# Patient Record
Sex: Male | Born: 1960 | Race: Black or African American | Hispanic: No | Smoking: Never smoker
Health system: Southern US, Community
[De-identification: ages and names within clinical notes are randomized; demographics above are authoritative.]

## PROBLEM LIST (undated history)

## (undated) DIAGNOSIS — E2609 Other primary hyperaldosteronism: Secondary | ICD-10-CM

## (undated) DIAGNOSIS — C61 Malignant neoplasm of prostate: Secondary | ICD-10-CM

## (undated) DIAGNOSIS — Z8052 Family history of malignant neoplasm of bladder: Secondary | ICD-10-CM

## (undated) DIAGNOSIS — I10 Essential (primary) hypertension: Secondary | ICD-10-CM

## (undated) DIAGNOSIS — Z8042 Family history of malignant neoplasm of prostate: Secondary | ICD-10-CM

## (undated) DIAGNOSIS — Z803 Family history of malignant neoplasm of breast: Secondary | ICD-10-CM

## (undated) DIAGNOSIS — E119 Type 2 diabetes mellitus without complications: Secondary | ICD-10-CM

## (undated) DIAGNOSIS — E785 Hyperlipidemia, unspecified: Secondary | ICD-10-CM

## (undated) DIAGNOSIS — Z8051 Family history of malignant neoplasm of kidney: Secondary | ICD-10-CM

## (undated) HISTORY — DX: Family history of malignant neoplasm of prostate: Z80.42

## (undated) HISTORY — PX: COLONOSCOPY: SHX174

## (undated) HISTORY — DX: Family history of malignant neoplasm of kidney: Z80.51

## (undated) HISTORY — DX: Family history of malignant neoplasm of breast: Z80.3

## (undated) HISTORY — PX: PROSTATE BIOPSY: SHX241

## (undated) HISTORY — DX: Family history of malignant neoplasm of bladder: Z80.52

---

## 2017-04-27 ENCOUNTER — Encounter: Payer: Self-pay | Admitting: Radiation Oncology

## 2017-05-05 ENCOUNTER — Encounter: Payer: Self-pay | Admitting: Radiation Oncology

## 2017-05-05 NOTE — Progress Notes (Signed)
GU Location of Tumor / Histology: prostatic adenocarcinoma  If Prostate Cancer, Gleason Score is (3 + 3) and PSA is (6.32). Prostate volume: 63.5 cc.  Legion Discher reports his PSA was first noted to be elevated during his routine physical in August 2018. PCP: Jefm Petty found the elevated PSA.  Also, Dr. Donley Redder is his urologist and Dr. Nydia Bouton is his nephrologist  Biopsies of prostate (if applicable) revealed:      Past/Anticipated interventions by urology, if any: prostate biopsy, referral to Dr. Tammi Klippel for consideration of radiation therapy  Past/Anticipated interventions by medical oncology, if any: no  Weight changes, if any: no  Bowel/Bladder complaints, if any: IPSS 6. Mild ED. Reports nocturia. Denies dysuria, hematuria, leakage or incontinence.   Nausea/Vomiting, if any: no  Pain issues, if any:  no  SAFETY ISSUES:  Prior radiation? no  Pacemaker/ICD? no  Possible current pregnancy? no  Is the patient on methotrexate? no  Current Complaints / other details:  57 year old male. Significant other: [place]. Has 9 siblings and he is the second to the youngest. Strong family hx of cancer. Patient reports being most interested in seeds but looking to explore all options.

## 2017-05-08 ENCOUNTER — Ambulatory Visit
Admission: RE | Admit: 2017-05-08 | Discharge: 2017-05-08 | Disposition: A | Discharge status: Home / Self Care | Payer: 59 | Source: Ambulatory Visit | Attending: Radiation Oncology | Admitting: Radiation Oncology

## 2017-05-08 ENCOUNTER — Encounter: Payer: Self-pay | Admitting: Medical Oncology

## 2017-05-08 ENCOUNTER — Encounter: Payer: Self-pay | Admitting: Radiation Oncology

## 2017-05-08 ENCOUNTER — Other Ambulatory Visit: Payer: Self-pay

## 2017-05-08 VITALS — BP 148/99 | HR 80 | Temp 98.3°F | Resp 18 | Ht 69.0 in | Wt 191.0 lb

## 2017-05-08 DIAGNOSIS — Z809 Family history of malignant neoplasm, unspecified: Secondary | ICD-10-CM

## 2017-05-08 DIAGNOSIS — Z7984 Long term (current) use of oral hypoglycemic drugs: Secondary | ICD-10-CM | POA: Diagnosis not present

## 2017-05-08 DIAGNOSIS — Z79899 Other long term (current) drug therapy: Secondary | ICD-10-CM | POA: Diagnosis not present

## 2017-05-08 DIAGNOSIS — Z806 Family history of leukemia: Secondary | ICD-10-CM | POA: Diagnosis not present

## 2017-05-08 DIAGNOSIS — E119 Type 2 diabetes mellitus without complications: Secondary | ICD-10-CM | POA: Insufficient documentation

## 2017-05-08 DIAGNOSIS — C61 Malignant neoplasm of prostate: Secondary | ICD-10-CM | POA: Insufficient documentation

## 2017-05-08 DIAGNOSIS — Z803 Family history of malignant neoplasm of breast: Secondary | ICD-10-CM | POA: Diagnosis not present

## 2017-05-08 DIAGNOSIS — Z8051 Family history of malignant neoplasm of kidney: Secondary | ICD-10-CM | POA: Insufficient documentation

## 2017-05-08 DIAGNOSIS — Z8042 Family history of malignant neoplasm of prostate: Secondary | ICD-10-CM | POA: Insufficient documentation

## 2017-05-08 DIAGNOSIS — I10 Essential (primary) hypertension: Secondary | ICD-10-CM | POA: Diagnosis not present

## 2017-05-08 HISTORY — DX: Essential (primary) hypertension: I10

## 2017-05-08 HISTORY — DX: Type 2 diabetes mellitus without complications: E11.9

## 2017-05-08 HISTORY — DX: Malignant neoplasm of prostate: C61

## 2017-05-08 NOTE — Progress Notes (Signed)
Introduced myself to patient and significant other as the prostate nurse navigator and my role. He states he is leaning towards brachytherapy. He currently sees Dr. Bal[facility]  and will need a referral to [facility]. I will continue to follow and asked him to call me with questions or concerns.

## 2017-05-08 NOTE — Progress Notes (Signed)
See progress note under physician encounter. 

## 2017-05-08 NOTE — Progress Notes (Addendum)
Radiation Oncology         [phone] ________________________________  Initial Outpatient Consultation  Name: Devin Gibson MRN: [id number]  Date: 05/08/2017  DOB: [birth year]  [id number], Legrand Como, MD  Ollen Bowl, MD   REFERRING PHYSICIAN: Ollen Bowl, MD  DIAGNOSIS: 57 y.o. 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    ICD-10-CM   1. Malignant neoplasm of prostate [facility]) [place] Ambulatory Referral to Genetics    Ambulatory referral to Urology  2. Family history of cancer Z80.9     HISTORY OF PRESENT ILLNESS: Devin Gibson is a 57 y.o. male with a new diagnosis of prostate cancer. He was first noted to have an elevated PSA in August 2018 during a routine physical exam by his primary care physician, Dr. Jefm Petty.  Accordingly, he was referred for evaluation in urology to Dr. Ollen Bowl, where a repeat PSA was drawn and remained elevated at 6.32. The patient proceeded to transrectal ultrasound with 12 biopsies of the prostate on 12/26/2016.  The prostate volume measured 63.5 cc.  Out of 12 core biopsies, 4 were positive.  The maximum Gleason score was 3+3, and this was seen in the right apex, right mid, right apex lateral, and right lateral mid. A digital rectal examination was performed on 01/31/2017 revealing a right-sided nodule.    The patient reviewed the biopsy results with his urologist and he has kindly been referred today for discussion of potential radiation treatment options. He is accompanied by his longtime girlfriend.   PREVIOUS RADIATION THERAPY: No  PAST MEDICAL HISTORY:  Past Medical History:  Diagnosis Date  . Diabetes mellitus without complication ([place])   . Hypertension   . Prostate cancer ([place])       PAST SURGICAL HISTORY: Past Surgical History:  Procedure Laterality Date  . PROSTATE BIOPSY      FAMILY HISTORY:  Family History  Problem Relation Age of Onset  . Kidney cancer  Sister        removed kidney/oral medication  . Prostate cancer Brother        prostatectomy  . Breast cancer Sister        lumpectomy and chemotherapy  . Leukemia Father     SOCIAL HISTORY:  Social History   Socioeconomic History  . Marital status: Significant Other    Spouse name: Not on file  . Number of children: Not on file  . Years of education: Not on file  . Highest education level: Not on file  Social Needs  . Financial resource strain: Not on file  . Food insecurity - worry: Not on file  . Food insecurity - inability: Not on file  . Transportation needs - medical: Not on file  . Transportation needs - non-medical: Not on file  Occupational History  . Not on file  Tobacco Use  . Smoking status: Never Smoker  . Smokeless tobacco: Never Used  Substance and Sexual Activity  . Alcohol use: No    Frequency: Never  . Drug use: No  . Sexual activity: Yes    Birth control/protection: Condom  Other Topics Concern  . Not on file  Social History Narrative   Patient and his significant other, [name], have been together for 29 years. 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  The patient is in a relationship with his girlfriend [place]. 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   ALLERGIES: Patient has no known allergies.  MEDICATIONS:  Current Outpatient Medications  Medication Sig Dispense Refill  . amLODipine-benazepril (LOTREL) 10-40 MG capsule Take by mouth.    Marland Kitchen atorvastatin (LIPITOR) 20 MG tablet Take by mouth.    . metFORMIN (GLUCOPHAGE) 1000 MG tablet Take by mouth.    . metoprolol succinate (TOPROL-XL) 100 MG 24 hr tablet Take 100 mg by mouth daily. Take with or immediately following a meal.    . spironolactone (ALDACTONE) 50 MG tablet Take by mouth.     No current facility-administered medications for this encounter.     REVIEW OF SYSTEMS:  On review of systems, the patient reports that he is doing  well overall. He denies any chest pain, shortness of breath, cough, fevers, chills, night sweats, or unintended weight changes. He denies any bowel disturbances, and denies abdominal pain, nausea or vomiting. He denies any new musculoskeletal or joint aches or pains. His IPSS was 6, indicating mild urinary symptoms with nocturia. He denies any dysuria, hematuria, leakage or incontinence. He reports mild erectile dysfunction. A complete review of systems is obtained and is otherwise negative.    PHYSICAL EXAM:  Wt Readings from Last 3 Encounters:  05/08/17 191 lb (86.6 kg)   Temp Readings from Last 3 Encounters:  05/08/17 98.3 F (36.8 C) (Oral)   BP Readings from Last 3 Encounters:  05/08/17 (!) 148/99   Pulse Readings from Last 3 Encounters:  05/08/17 80   Pain Assessment Pain Score: 0-No pain/10  In general this is a well appearing African-American man in no acute distress. He is alert and oriented x4 and appropriate throughout the examination. HEENT reveals that the patient is normocephalic, atraumatic. Skin is intact without any evidence of gross lesions. Cardiovascular exam reveals a regular rate and rhythm, no clicks rubs or murmurs are auscultated. Chest is clear to auscultation bilaterally. Lymphatic assessment is performed and does not reveal any adenopathy in the cervical, supraclavicular, axillary, or inguinal chains.   KPS = 100  100 - Normal; no complaints; no evidence of disease. 90   - Able to carry on normal activity; minor signs or symptoms of disease. 80   - Normal activity with effort; some signs or symptoms of disease. 43   - Cares for self; unable to carry on normal activity or to do active work. 60   - Requires occasional assistance, but is able to care for most of his personal needs. 50   - Requires considerable assistance and frequent medical care. 31   - Disabled; requires special care and assistance. 54   - Severely disabled; hospital admission is indicated  although death not imminent. 69   - Very sick; hospital admission necessary; active supportive treatment necessary. 10   - Moribund; fatal processes progressing rapidly. 0     - Dead  Karnofsky DA, Abelmann WH, Craver LS and Burchenal JH [phone]) The use of the nitrogen mustards in the palliative treatment of carcinoma: with particular reference to bronchogenic carcinoma Cancer 1 634-56  LABORATORY DATA:  No results found for: WBC, HGB, HCT, MCV, PLT No results found for: NA, K, CL, CO2 No results found for: ALT, AST, GGT, ALKPHOS, BILITOT   RADIOGRAPHY: No results found.    IMPRESSION/PLAN: 1. 57 y.o. gentleman with Low risk, T2a adenocarcinoma of the prostate with Gleason Score of 3+3, and PSA of 6.32. We discussed the pathology findings and reviews the nature of low risk adenocarcinoma of the prostate.  We discussed how T-stage, Gleason Score, and PSA impact recommendations of treatment. He is a candidate for active surveillance, prostatectomy, external radiotherapy, or brachytherapy. We reviewed the options of radiotherapy as the patient is interested in treatment rather than active surveillance, but is not interested in surgery. After reviewing the options and logistics of treatment, he favors radioactive seed implant.   We discussed the risks, benefits, short, and long term effects of radiotherapy, and the patient is interested in proceeding. We will coordinate for referral to urology so he can meet one of the local urologists in order to proceed with seed implant. He is in agreement and will call us if he has questions or concerns regarding this process.  2. Possible genetic predisposition to malignancy. The patient is counseled on the role of genetic testing given his personal and family history. He is in agreement with referral.        Carola Rhine, PAC And  Sheral Apley Tammi Klippel, M.D.  This document serves as a record of services personally performed by Tyler Pita, MD and  Shona Simpson, PA-C. It was created on their behalf by Rae Lips, a trained medical scribe. The creation of this record is based on the scribe's personal observations and the providers' statements to them. This document has been checked and approved by the attending providers.

## 2017-05-09 ENCOUNTER — Telehealth: Payer: Self-pay | Admitting: *Deleted

## 2017-05-09 NOTE — Telephone Encounter (Signed)
Called patient to inform of consultation with Dr. Diona Fanti on 06-05-17 - arrival time - 2 pm, spoke with patient's wife Masson Nalepa and she is aware of this appt.

## 2017-05-12 DIAGNOSIS — C61 Malignant neoplasm of prostate: Secondary | ICD-10-CM | POA: Insufficient documentation

## 2017-06-07 ENCOUNTER — Encounter: Payer: Self-pay | Admitting: Urology

## 2017-06-07 NOTE — Progress Notes (Signed)
This patient met with Dr. Diona Fanti in consult on 06/05/2017  To discuss options for treatment of his prostate cancer.  Dr. Diona Fanti agrees that he is an excellent candidate for brachytherapy and is happy to participate in this procedure.  We will begin moving forward with coordinating this procedure with Dr. Alan Ripper surgery schedulers for the near future.

## 2017-06-14 ENCOUNTER — Telehealth: Payer: Self-pay | Admitting: *Deleted

## 2017-06-14 NOTE — Telephone Encounter (Signed)
Called patient to inform of pre-seed appts. For 06-30-17 and his implant on 08-21-17 @ 2 pm, spoke with patient's sgn. other - Tameka and she is aware of these appts.

## 2017-06-15 ENCOUNTER — Other Ambulatory Visit: Payer: Self-pay | Admitting: Urology

## 2017-06-29 ENCOUNTER — Inpatient Hospital Stay: Payer: 59

## 2017-06-29 ENCOUNTER — Telehealth: Payer: Self-pay | Admitting: *Deleted

## 2017-06-29 ENCOUNTER — Inpatient Hospital Stay: Payer: 59 | Attending: Genetic Counselor | Admitting: Genetics

## 2017-06-29 DIAGNOSIS — Z8051 Family history of malignant neoplasm of kidney: Secondary | ICD-10-CM | POA: Diagnosis not present

## 2017-06-29 DIAGNOSIS — Z803 Family history of malignant neoplasm of breast: Secondary | ICD-10-CM

## 2017-06-29 DIAGNOSIS — Z8042 Family history of malignant neoplasm of prostate: Secondary | ICD-10-CM | POA: Diagnosis not present

## 2017-06-29 DIAGNOSIS — Z8052 Family history of malignant neoplasm of bladder: Secondary | ICD-10-CM | POA: Diagnosis not present

## 2017-06-29 DIAGNOSIS — C61 Malignant neoplasm of prostate: Secondary | ICD-10-CM | POA: Diagnosis not present

## 2017-06-29 NOTE — Telephone Encounter (Signed)
CALLED PATIENT TO REMIND OF PRE-SEED APPTS. FOR 06-30-17, SPOKE WITH PATIENT AND HE IS AWARE OF THESE APPTS.

## 2017-06-30 ENCOUNTER — Encounter: Payer: Self-pay | Admitting: Genetics

## 2017-06-30 ENCOUNTER — Other Ambulatory Visit: Payer: Self-pay | Admitting: Urology

## 2017-06-30 ENCOUNTER — Ambulatory Visit
Admission: RE | Admit: 2017-06-30 | Discharge: 2017-06-30 | Disposition: A | Discharge status: Home / Self Care | Payer: 59 | Source: Ambulatory Visit | Attending: Radiation Oncology | Admitting: Radiation Oncology

## 2017-06-30 ENCOUNTER — Other Ambulatory Visit: Payer: Self-pay

## 2017-06-30 ENCOUNTER — Encounter: Payer: Self-pay | Admitting: Medical Oncology

## 2017-06-30 ENCOUNTER — Encounter (HOSPITAL_COMMUNITY)
Admission: RE | Admit: 2017-06-30 | Discharge: 2017-06-30 | Disposition: A | Discharge status: Home / Self Care | Payer: 59 | Source: Ambulatory Visit | Attending: Urology | Admitting: Urology

## 2017-06-30 ENCOUNTER — Ambulatory Visit (HOSPITAL_COMMUNITY)
Admission: RE | Admit: 2017-06-30 | Discharge: 2017-06-30 | Disposition: A | Discharge status: Home / Self Care | Payer: 59 | Source: Ambulatory Visit | Attending: Urology | Admitting: Urology

## 2017-06-30 DIAGNOSIS — C61 Malignant neoplasm of prostate: Secondary | ICD-10-CM | POA: Diagnosis present

## 2017-06-30 DIAGNOSIS — Z510 Encounter for antineoplastic radiation therapy: Secondary | ICD-10-CM | POA: Diagnosis not present

## 2017-06-30 DIAGNOSIS — Z8052 Family history of malignant neoplasm of bladder: Secondary | ICD-10-CM | POA: Insufficient documentation

## 2017-06-30 DIAGNOSIS — Z01810 Encounter for preprocedural cardiovascular examination: Secondary | ICD-10-CM | POA: Insufficient documentation

## 2017-06-30 DIAGNOSIS — Z8051 Family history of malignant neoplasm of kidney: Secondary | ICD-10-CM | POA: Insufficient documentation

## 2017-06-30 DIAGNOSIS — Z803 Family history of malignant neoplasm of breast: Secondary | ICD-10-CM | POA: Insufficient documentation

## 2017-06-30 DIAGNOSIS — Z01818 Encounter for other preprocedural examination: Secondary | ICD-10-CM | POA: Diagnosis present

## 2017-06-30 DIAGNOSIS — Z8042 Family history of malignant neoplasm of prostate: Secondary | ICD-10-CM | POA: Insufficient documentation

## 2017-06-30 NOTE — Progress Notes (Signed)
  Radiation Oncology         [phone]) [phone] ________________________________  Name: Devin Gibson MRN: [id number]  Date: 06/30/2017  DOB: [birth year]  SIMULATION AND TREATMENT PLANNING NOTE PUBIC ARCH STUDY  [id number], Legrand Como, MD  Ollen Bowl, MD  DIAGNOSIS: 57 y.o. 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     ICD-10-CM   1. Malignant neoplasm of prostate ([place]) C61     COMPLEX SIMULATION:  The patient presented today for evaluation for possible prostate seed implant. He was brought to the radiation planning suite and placed supine on the CT couch. A 3-dimensional image study set was obtained in upload to the planning computer. There, on each axial slice, I contoured the prostate gland. Then, using three-dimensional radiation planning tools I reconstructed the prostate in view of the structures from the transperineal needle pathway to assess for possible pubic arch interference. In doing so, I did not appreciate any pubic arch interference. Also, the patient's prostate volume was estimated based on the drawn structure. The volume was 54 cc.  Given the pubic arch appearance and prostate volume, patient remains a good candidate to proceed with prostate seed implant. Today, he freely provided informed written consent to proceed.    PLAN: The patient will undergo prostate seed implant.   ________________________________  Sheral Apley. Tammi Klippel, M.D.   This document serves as a record of services personally performed by Tyler Pita, MD. It was created on his behalf by Arlyce Harman, a trained medical scribe. The creation of this record is based on the scribe's personal observations and the provider's statements to them. This document has been checked and approved by the attending provider.

## 2017-06-30 NOTE — Progress Notes (Signed)
REFERRING PROVIDER: Hayden Pedro, PA-C [place], [place] [address]  PRIMARY PROVIDER:  Jefm Petty, MD  PRIMARY REASON FOR VISIT:  1. Malignant neoplasm of prostate ([place])   2. Family history of kidney cancer   3. Family history of prostate cancer   4. Family history of breast cancer   5. Family history of bladder cancer     HISTORY OF PRESENT ILLNESS:   Mr. Devin Gibson, a 57 y.o. male, was seen for a [place] cancer genetics consultation at the request of Dr. Dara Lords due to a personal and family history of cancer.  Devin Gibson presents to clinic today to discuss the possibility of a hereditary predisposition to cancer, genetic testing, and to further clarify his future cancer risks, as well as potential cancer risks for family members.   Devin Gibson is a 57 year old man recently diagnosed with adenocarcinoma of the prostate with Gleason score 3+3=6.  He will be undergoing prostate seed implant.  Colonoscopy: yes; reportedly normal.   Past Medical History:  Diagnosis Date  . Diabetes mellitus without complication ([place])   . Family history of bladder cancer   . Family history of breast cancer   . Family history of kidney cancer   . Family history of prostate cancer   . Hypertension   . Prostate cancer [facility])     Past Surgical History:  Procedure Laterality Date  . PROSTATE BIOPSY      Social History   Socioeconomic History  . Marital status: Significant Other    Spouse name: Not on file  . Number of children: Not on file  . Years of education: Not on file  . Highest education level: Not on file  Occupational History  . Not on file  Social Needs  . Financial resource strain: Not on file  . Food insecurity:    Worry: Not on file    Inability: Not on file  . Transportation needs:    Medical: Not on file    Non-medical: Not on file  Tobacco Use  . Smoking status: Never Smoker  . Smokeless tobacco: Never Used  Substance and Sexual Activity   . Alcohol use: No    Frequency: Never  . Drug use: No  . Sexual activity: Yes    Birth control/protection: Condom  Lifestyle  . Physical activity:    Days per week: Not on file    Minutes per session: Not on file  . Stress: Not on file  Relationships  . Social connections:    Talks on phone: Not on file    Gets together: Not on file    Attends religious service: Not on file    Active member of club or organization: Not on file    Attends meetings of clubs or organizations: Not on file    Relationship status: Not on file  Other Topics Concern  . Not on file  Social History Narrative   Patient and his significant other, [name], have been together for 29 years. 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     FAMILY HISTORY:  We obtained a detailed, 4-generation family history.  Significant diagnoses are listed below: Family History  Problem Relation Age of Onset  . Kidney cancer Sister 59       removed kidney/oral medication  . Prostate cancer Brother 76       prostatectomy  . Breast cancer Sister 50  lumpectomy and chemotherapy  . Leukemia Father 61  . Cancer Paternal Aunt        type unk  . Lung cancer Paternal Uncle   . Bladder Cancer Other        dx >50    Devin Gibson has 1 daughter who is 84 with no history of cancer.  He has 4 grandchildren with no history of cancer. Devin Gibson has 2 brothers and 7 sisters: -1 brother was diagnosed with prostate cancer at 53, he had a prostatectomy. He has 4 sons and a daughter with no history of cancer.  -1 brother has no history of cancer and has a son and daughter with no history of cancer.  -1 sister with no history of cancer.  She had a son who developed bladder cancer dx >50.  He had a history of smoking.  -1 sister died at 5 due to kidney cancer that was diagnosed at 62.   -1 sister was diagnosed with breast cancer at 91.  She has a son and a daughter with no history of cancer.  -4  sisters with no history of cancer.   Devin Gibson' father died at 61 and had a history of leukemia dx in his 70's/80's.  Devin Gibson had 4 paternal uncles and 4 paternal aunts.  One aunt died of cancer, the type and age of dx is unknown.  One paternal uncle had lung cancer.  No paternal cousins with any history of cancer.  Devin Gibson' dies not know any information about his paternal grandparents' health.    Devin Gibson' mother very recently died at the age of 14 with no history of cancer.  Devin Gibson has 1 maternal uncle and 3 maternal aunts with no history of cancer.  No maternal cousins with any history of cancer.  Devin Gibson does no know any information about his paternal grandparents.   Devin Gibson is unaware of previous family history of genetic testing for hereditary cancer risks. Patient's maternal ancestors are of African American descent, and paternal ancestors are of African American descent. There is no reported Ashkenazi Jewish ancestry. There is no known consanguinity.  GENETIC COUNSELING ASSESSMENT: [name] is a 57 y.o. male with a personal and family history which is somewhat suggestive of a Hereditary Cancer Predisposition Syndrome. We, therefore, discussed and recommended the following at today's visit.   DISCUSSION: We reviewed the characteristics, features and inheritance patterns of hereditary cancer syndromes. We also discussed genetic testing, including the appropriate family members to test, the process of testing, insurance coverage and turn-around-time for results. We discussed the implications of a negative, positive and/or variant of uncertain significant result. We recommended Mr. Lahaie pursue genetic testing for the Common hereditary Cancer Panel + Renal/Urinary Tract cancers gene panel. The Common Hereditary Cancer Panel offered by [facility] includes sequencing and/or deletion duplication testing of the following 47 genes: APC, ATM, AXIN2, BARD1, BMPR1A,  BRCA1, BRCA2, BRIP1, CDH1, CDKN2A (p14ARF), CDKN2A (p16INK4a), CKD4, CHEK2, CTNNA1, DICER1, EPCAM (Deletion/duplication testing only), GREM1 (promoter region deletion/duplication testing only), KIT, MEN1, MLH1, MSH2, MSH3, MSH6, MUTYH, NBN, NF1, NHTL1, PALB2, PDGFRA, PMS2, POLD1, POLE, PTEN, RAD50, RAD51C, RAD51D, SDHB, SDHC, SDHD, SMAD4, SMARCA4. STK11, TP53, TSC1, TSC2, and VHL.  The following genes were evaluated for sequence changes only: SDHA and HOXB13 c.251G>A variant only.  [facility] Renal/Urinary Tract Cancers Panel:BAP1 CDC73 CDKN1C DICER1 DIS3L2 EPCAM FH FLCN GPC3 MET MLH1 MSH2 MSH6 PMS2 PTEN SDHB SDHC SMARCA4 SMARCB1 TP53 TSC1 TSC2 VHL WT1 BUB1B CEP57 MITF  PALB2 SDHA SDHD   We discussed that only 5-10% of cancers are associated with a Hereditary cancer predisposition syndrome.  One of the most common hereditary cancer syndromes that increases prostate cancer risk is called Hereditary Breast and Ovarian Cancer (HBOC) syndrome.  This syndrome is caused by mutations in the BRCA1 and BRCA2 genes.  This syndrome increases an individual's lifetime risk to develop prostate, breast, ovarian, pancreatic, and other types of cancer.  There are also many other cancer predisposition syndromes caused by mutations in several other genes.    We discussed that if he is found to have a mutation in one of these genes, it may impact future medical management recommendations such as increased cancer screenings and consideration of risk reducing surgeries.  A positive result could also have implications for the patient's family members.  A Negative result would mean we were unable to identify a hereditary component to his cancer, but does not rule out the possibility of a hereditary basis for his cancer.  There could be mutations that are undetectable by current technology, or in genes not yet tested or identified to increase cancer risk.    We discussed the potential to find a Variant of Uncertain Significance or  VUS.  These are variants that have not yet been identified as pathogenic or benign, and it is unknown if this variant is associated with increased cancer risk or if this is a normal finding.  Most VUS's are reclassified to benign or likely benign.   It should not be used to make medical management decisions. With time, we suspect the lab will determine the significance of any VUS's identified if any.   Based on Mr. Salts personal and family history of cancer, he meets medical criteria for genetic testing. Despite that he meets criteria, he may still have an out of pocket cost. We discussed that if his out of pocket cost for testing is over $100, the laboratory will call and confirm whether he wants to proceed with testing.  If the out of pocket cost of testing is less than $100 he will be billed by the genetic testing laboratory.   PLAN: After considering the risks, benefits, and limitations, Mr. Mcneill  provided informed consent to pursue genetic testing and the blood sample was sent to [facility] for analysis of the Common Hereditary Cancers Panel + Renal/Urinary Tract cancer panel. Results should be available within approximately 2-3 weeks' time, at which point they will be disclosed by telephone to Mr. Martos, as will any additional recommendations warranted by these results. Mr. Wambold will receive a summary of his genetic counseling visit and a copy of his results once available. This information will also be available in Epic. We encouraged Mr. Rawlinson to remain in contact with cancer genetics annually so that we can continuously update the family history and inform him of any changes in cancer genetics and testing that may be of benefit for his family. Mr. Misch questions were answered to his satisfaction today. Our contact information was provided should additional questions or concerns arise.  Based on Mr. Iyer family history, we recommended his sister who had breast  cancer at 41 have genetic counseling and testing. Mr. Caudill will let us know if we can be of any assistance in coordinating genetic counseling and/or testing for this family member.   Lastly, we encouraged Mr. Summer to remain in contact with cancer genetics annually so that we can continuously update the family history and inform him of any  changes in cancer genetics and testing that may be of benefit for this family.   Mr.  Lanum questions were answered to his satisfaction today. Our contact information was provided should additional questions or concerns arise. Thank you for the referral and allowing [place] to share in the care of your patient.   Tana Felts, MS, [facility] Certified Genetic Counselor [email or web address] phone: [phone]  The patient was seen for a total of 25 minutes in face-to-face genetic counseling. The patient was accompanied today by his significant other.

## 2017-07-19 ENCOUNTER — Telehealth: Payer: Self-pay | Admitting: Genetics

## 2017-07-19 ENCOUNTER — Encounter: Payer: Self-pay | Admitting: Genetics

## 2017-07-19 ENCOUNTER — Ambulatory Visit: Payer: Self-pay | Admitting: Genetics

## 2017-07-19 DIAGNOSIS — Z803 Family history of malignant neoplasm of breast: Secondary | ICD-10-CM

## 2017-07-19 DIAGNOSIS — Z8051 Family history of malignant neoplasm of kidney: Secondary | ICD-10-CM

## 2017-07-19 DIAGNOSIS — Z1379 Encounter for other screening for genetic and chromosomal anomalies: Secondary | ICD-10-CM

## 2017-07-19 DIAGNOSIS — Z8052 Family history of malignant neoplasm of bladder: Secondary | ICD-10-CM

## 2017-07-19 DIAGNOSIS — C61 Malignant neoplasm of prostate: Secondary | ICD-10-CM

## 2017-07-19 DIAGNOSIS — Z8042 Family history of malignant neoplasm of prostate: Secondary | ICD-10-CM

## 2017-07-19 NOTE — Telephone Encounter (Signed)
Revealed negative genetic testing.   This normal result indicates that it is unlikely Devin Gibson cancer is due to a hereditary cause.  It is unlikely that there is an increased risk of another cancer due to a mutation in one of these genes.  However, genetic testing is not perfect, and cannot definitively rule out a hereditary cause.  It will be important for him to keep in contact with genetics to learn if any additional testing may be needed in the future.    We recommended his other relatives, especially those affected with cancer also get genetic testing because there can still be a hereditary cause for the cancer in his family that he did not inherit, but these relatives could carry.  All relatives should tell their doctors about the family history of cancer so they can make the best screening recommendations for them.

## 2017-07-19 NOTE — Progress Notes (Signed)
HPI: Devin Gibson was previously seen in the [place] clinic on 06/29/2017 due to a personal and family history of cancer and concerns regarding a hereditary predisposition to cancer. Please refer to our prior cancer genetics clinic note for more information regarding Devin Gibson medical, social and family histories, and our assessment and recommendations, at the time. Devin Gibson recent genetic test results were disclosed to him, as well as recommendations warranted by these results. These results and recommendations are discussed in more detail below.  CANCER HISTORY:    Malignant neoplasm of prostate ([place])   05/12/2017 Initial Diagnosis    Malignant neoplasm of prostate ([place])      07/17/2017 Genetic Testing    The Common Hereditary Cancers Panel + Renal/Urinary Tract Cancers Panel was ordered (60 genes). The following genes were evaluated for sequence changes and exonic deletions/duplications: APC, ATM, AXIN2, BAP1, BARD1, BMPR1A, BRCA1, BRCA2, BRIP1, BUB1B, CDC73, CDH1, CDK4, CDKN1C, CDKN2A (p14ARF), CDKN2A (p16INK4a), CEP57, CHEK2, CTNNA1, DICER1, DIS3L2, EPCAM*, FH, FLCN, GPC3, GREM1*, KIT, MEN1, MET, MLH1, MSH2, MSH3, MSH6, MUTYH, NBN, NF1, PALB2, PDGFRA, PMS2, POLD1, POLE, PTEN, RAD50, RAD51C, RAD51D, SDHB, SDHC, SDHD, SMAD4, SMARCA4, SMARCB1, STK11, TP53, TSC1, TSC2, VHL, WT1 The following genes were evaluated for sequence changes only: HOXB13*, MITF*, NTHL1*, SDHA  Results: Negative.  No pathogenic variants identified.  The date of this test report is 07/17/2017.         FAMILY HISTORY:  We obtained a detailed, 4-generation family history.  Significant diagnoses are listed below: Family History  Problem Relation Age of Onset  . Kidney cancer Sister 28       removed kidney/oral medication  . Prostate cancer Brother 74       prostatectomy  . Breast cancer Sister 63       lumpectomy and chemotherapy  . Leukemia Father 72  . Cancer Paternal Aunt        type unk   . Lung cancer Paternal Uncle   . Bladder Cancer Other        dx >50     Devin Gibson has 1 daughter who is 24 with no history of cancer.  He has 4 grandchildren with no history of cancer. Devin Gibson has 2 brothers and 7 sisters: -1 brother was diagnosed with prostate cancer at 73, he had a prostatectomy. He has 4 sons and a daughter with no history of cancer.  -1 brother has no history of cancer and has a son and daughter with no history of cancer.  -1 sister with no history of cancer.  She had a son who developed bladder cancer dx >50.  He had a history of smoking.  -1 sister died at 86 due to kidney cancer that was diagnosed at 31.   -1 sister was diagnosed with breast cancer at 38.  She has a son and a daughter with no history of cancer.  -4 sisters with no history of cancer.   Devin Gibson' father died at 33 and had a history of leukemia dx in his 54's/80's.  Devin Gibson had 4 paternal uncles and 4 paternal aunts.  One aunt died of cancer, the type and age of dx is unknown.  One paternal uncle had lung cancer.  No paternal cousins with any history of cancer.  Devin Gibson' dies not know any information about his paternal grandparents' health.    Devin Gibson' mother very recently died at the age of 28 with no history of cancer.  Devin Gibson has  1 maternal uncle and 3 maternal aunts with no history of cancer.  No maternal cousins with any history of cancer.  Devin Gibson does no know any information about his paternal grandparents.   Devin Gibson is unaware of previous family history of genetic testing for hereditary cancer risks. Patient's maternal ancestors are of African American descent, and paternal ancestors are of African American descent. There is no reported Ashkenazi Jewish ancestry. There is no known consanguinity.  GENETIC TEST RESULTS: Genetic testing performed through [facility] Common Hereditary Cancers Panel + Renal/Urinary tract cancers panel reported out on 07/17/2017  showed no pathogenic mutations. The following genes were evaluated for sequence changes and exonic deletions/duplications: APC, ATM, AXIN2, BAP1, BARD1, BMPR1A, BRCA1, BRCA2, BRIP1, BUB1B, CDC73, CDH1, CDK4, CDKN1C, CDKN2A (p14ARF), CDKN2A (p16INK4a), CEP57, CHEK2, CTNNA1, DICER1, DIS3L2, EPCAM*, FH, FLCN, GPC3, GREM1*, KIT, MEN1, MET, MLH1, MSH2, MSH3, MSH6, MUTYH, NBN, NF1, PALB2, PDGFRA, PMS2, POLD1, POLE, PTEN, RAD50, RAD51C, RAD51D, SDHB, SDHC, SDHD, SMAD4, SMARCA4, SMARCB1, STK11, TP53, TSC1, TSC2, VHL, WT1 The following genes were evaluated for sequence changes only: HOXB13*, MITF*, NTHL1*, SDHA.  The test report will be scanned into EPIC and will be located under the Molecular Pathology section of the Results Review tab.A portion of the result report is included below for reference.     We discussed with Devin Gibson that because current genetic testing is not perfect, it is possible there may be a gene mutation in one of these genes that current testing cannot detect, but that chance is small. We also discussed, that there could be another gene that has not yet been discovered, or that we have not yet tested, that is responsible for the cancer diagnoses in the family. It is also possible there is a hereditary cause for the cancer in the family that Devin Gibson did not inherit and therefore was not identified in his testing.  Therefore, it is important to remain in touch with cancer genetics in the future so that we can continue to offer Devin Gibson the most up to date genetic testing.   ADDITIONAL GENETIC TESTING: We discussed with Devin Gibson that there are other genes that are associated with increased cancer risk that can be analyzed. The laboratories that offer this testing look at these additional genes via a hereditary cancer gene panel. Should Devin Gibson wish to pursue additional genetic testing, we are happy to discuss and coordinate this testing, at any time.    CANCER SCREENING  RECOMMENDATIONS: Devin Gibson test result is considered negative (normal).  This means that we have not identified a hereditary cause for his personal and family history of cancer at this time.   While reassuring, this does not definitively rule out a hereditary basis for his cancer. It is still possible that there could be genetic mutations that are undetectable by current technology, or genetic mutations in genes that have not been tested or identified to increase cancer risk.    Therefore, Devin Gibson was advised to continue following the cancer screening guidelines provided by his primary healthcare providers. Other factors such as her personal and family history may still affect his cancer risk.    RECOMMENDATIONS FOR FAMILY MEMBERS: Relatives in this family might be at some increased risk of developing cancer, over the general population risk, simply due to the family history of cancer. We recommended women in this family have a yearly mammogram beginning at age 56, or 9 years younger than the earliest onset of cancer, an annual clinical breast  exam, and perform monthly breast self-exams. Women in this family should also have a gynecological exam as recommended by their primary provider. All family members should have a colonoscopy by age 64 (or earlier based on personal/family history).  All family members should inform their physicians about the family history of cancer so their doctors can make the most appropriate screening recommendations for them.   It is still possible there is a hereditary cause for the cancer in Devin Gibson' family that he did not inherit.  Therefore we recommend all of Devin Gibson'  relatives (especially those affected with cancer) also have genetic counseling and testing. Devin Gibson will let us know if we can be of any assistance in coordinating genetic counseling and/or testing for these family members.   FOLLOW-UP: Lastly, we discussed with Mr. Laski that  cancer genetics is a rapidly advancing field and it is possible that new genetic tests will be appropriate for him and/or his family members in the future. We encouraged him to remain in contact with cancer genetics on an annual basis so we can update his personal and family histories and let him know of advances in cancer genetics that may benefit this family.   Our contact number was provided. Mr. Zappone questions were answered to his satisfaction, and he knows he is welcome to call us at anytime with additional questions or concerns.   Ferol Luz, MS, [facility] Certified Genetic Counselor [email or web address]

## 2017-08-11 ENCOUNTER — Other Ambulatory Visit: Payer: Self-pay

## 2017-08-11 ENCOUNTER — Encounter (HOSPITAL_BASED_OUTPATIENT_CLINIC_OR_DEPARTMENT_OTHER): Payer: Self-pay

## 2017-08-11 ENCOUNTER — Telehealth: Payer: Self-pay | Admitting: *Deleted

## 2017-08-11 NOTE — Telephone Encounter (Signed)
CALLED PATIENT TO INFORM OF LAB APPT. FOR 08-14-17 @ 10 AM , SPOKE WITH PATIENT'S SGO, MS. PARKER  AND SHE IS AWARE OF THIS APPT.

## 2017-08-11 NOTE — Progress Notes (Signed)
Spoke with:  Linton Rump NPO:  No food after midnight/Clear liquids until ) 8:00AM DOS Arrival time:  1200Noon Labs:  EKG/CXR 06/30/2017, CBC, CMP, PT, PTT 08/14/2017 in epic AM medications:  Fleet enema, Atorvastatin, Metoprolol Pre op orders:  Yes Ride home:  [facility] ([place]) [phone]

## 2017-08-14 ENCOUNTER — Encounter (HOSPITAL_COMMUNITY)
Admission: RE | Admit: 2017-08-14 | Discharge: 2017-08-14 | Disposition: A | Discharge status: Home / Self Care | Payer: 59 | Source: Ambulatory Visit | Attending: Urology | Admitting: Urology

## 2017-08-14 DIAGNOSIS — Z01812 Encounter for preprocedural laboratory examination: Secondary | ICD-10-CM | POA: Diagnosis present

## 2017-08-14 LAB — CBC
HCT: 45.1 % (ref 39.0–52.0)
Hemoglobin: 14.8 g/dL (ref 13.0–17.0)
MCH: 28.5 pg (ref 26.0–34.0)
MCHC: 32.8 g/dL (ref 30.0–36.0)
MCV: 86.7 fL (ref 78.0–100.0)
Platelets: 235 10*3/uL (ref 150–400)
RBC: 5.2 MIL/uL (ref 4.22–5.81)
RDW: 12.9 % (ref 11.5–15.5)
WBC: 5.9 10*3/uL (ref 4.0–10.5)

## 2017-08-14 LAB — COMPREHENSIVE METABOLIC PANEL
ALBUMIN: 3.7 g/dL (ref 3.5–5.0)
ALT: 14 U/L — AB (ref 17–63)
AST: 18 U/L (ref 15–41)
Alkaline Phosphatase: 65 U/L (ref 38–126)
Anion gap: 7 (ref 5–15)
BUN: 17 mg/dL (ref 6–20)
CO2: 26 mmol/L (ref 22–32)
Calcium: 8.9 mg/dL (ref 8.9–10.3)
Chloride: 107 mmol/L (ref 101–111)
Creatinine, Ser: 1.19 mg/dL (ref 0.61–1.24)
GFR calc Af Amer: >60 mL/min (ref >60)
GFR calc non Af Amer: >60 mL/min (ref >60)
GLUCOSE: 94 mg/dL (ref 65–99)
POTASSIUM: 4.5 mmol/L (ref 3.5–5.1)
Sodium: 140 mmol/L (ref 135–145)
Total Bilirubin: 0.6 mg/dL (ref 0.3–1.2)
Total Protein: 7.1 g/dL (ref 6.5–8.1)

## 2017-08-14 LAB — PROTIME-INR
INR: 0.99
Prothrombin Time: 13 seconds (ref 11.4–15.2)

## 2017-08-14 LAB — APTT: APTT: 31 s (ref 24–36)

## 2017-08-14 NOTE — Progress Notes (Signed)
CMP results 08/14/2017 faxed to Dr. Diona Fanti via epic.

## 2017-08-18 ENCOUNTER — Telehealth: Payer: Self-pay | Admitting: *Deleted

## 2017-08-18 NOTE — Telephone Encounter (Signed)
CALLED PATIENT TO REMIND OF PROCEDURE ON 08-21-17, SPOKE WITH TONECKA PARKER AND SHE IS AWARE OF THIS PROCEDURE

## 2017-08-20 NOTE — Progress Notes (Signed)
  Radiation Oncology         [phone] ________________________________  Name: Devin Gibson MRN: [id number]  Date: 08/23/2017  DOB: [birth year]       Prostate Seed Implant  XI:HWTUUE, Legrand Como, MD  No ref. provider found  DIAGNOSIS: 57 y.o. 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  RADIATION DOSE: 145 Gy, definitive therapy.  TECHNIQUE: Devin Gibson was brought to the operating room with the urologist. He was placed in the dorsolithotomy position. He was catheterized and a rectal tube was inserted. The perineum was shaved, prepped and draped. The ultrasound probe was then introduced into the rectum to see the prostate gland.  TREATMENT DEVICE: A needle grid was attached to the ultrasound probe stand and anchor needles were placed.  3D PLANNING: The prostate was imaged in 3D using a sagittal sweep of the prostate probe. These images were transferred to the planning computer. There, the prostate, urethra and rectum were defined on each axial reconstructed image. Then, the software created an optimized 3D plan and a few seed positions were adjusted. The quality of the plan was reviewed using [facility] information for the target and the following two organs at risk:  Urethra and Rectum.  Then the accepted plan was printed and handed off to the radiation therapist.  Under my supervision, the custom loading of the seeds and spacers was carried out and loaded into sealed vicryl sleeves.  These pre-loaded needles were then placed into the needle holder.Marland Kitchen  PROSTATE VOLUME STUDY:  Using transrectal ultrasound the volume of the prostate was verified to be 76.7 cc.  SPECIAL TREATMENT PROCEDURE/SUPERVISION AND HANDLING: The pre-loaded needles were then delivered under sagittal guidance. A total of 24 needles were used to deposit 100 seeds in the prostate gland. The  individual seed activity was 0.469 mCi.  SpaceOAR:  Yes  COMPLEX SIMULATION: At the end of the procedure, an anterior radiograph of the pelvis was obtained to document seed positioning and count. Cystoscopy was performed to check the urethra and bladder.  MICRODOSIMETRY: At the end of the procedure, the patient was emitting 0.2 mR/hr at 1 meter. Accordingly, he was considered safe for hospital discharge.  PLAN: The patient will return to the radiation oncology clinic for post implant CT dosimetry in three weeks.   ________________________________  Sheral Apley Tammi Klippel, M.D.

## 2017-08-21 ENCOUNTER — Ambulatory Visit (HOSPITAL_COMMUNITY): Payer: 59

## 2017-08-21 ENCOUNTER — Ambulatory Visit (HOSPITAL_BASED_OUTPATIENT_CLINIC_OR_DEPARTMENT_OTHER)
Admission: RE | Admit: 2017-08-21 | Discharge: 2017-08-21 | Disposition: A | Discharge status: Home / Self Care | Payer: 59 | Source: Ambulatory Visit | Attending: Urology | Admitting: Urology

## 2017-08-21 ENCOUNTER — Ambulatory Visit (HOSPITAL_BASED_OUTPATIENT_CLINIC_OR_DEPARTMENT_OTHER): Payer: 59 | Admitting: Anesthesiology

## 2017-08-21 ENCOUNTER — Other Ambulatory Visit: Payer: Self-pay

## 2017-08-21 ENCOUNTER — Encounter (HOSPITAL_BASED_OUTPATIENT_CLINIC_OR_DEPARTMENT_OTHER): Payer: Self-pay

## 2017-08-21 ENCOUNTER — Encounter (HOSPITAL_BASED_OUTPATIENT_CLINIC_OR_DEPARTMENT_OTHER)
Admission: RE | Disposition: A | Discharge status: Home / Self Care | Payer: Self-pay | Source: Ambulatory Visit | Attending: Urology

## 2017-08-21 DIAGNOSIS — I10 Essential (primary) hypertension: Secondary | ICD-10-CM | POA: Insufficient documentation

## 2017-08-21 DIAGNOSIS — Z806 Family history of leukemia: Secondary | ICD-10-CM | POA: Insufficient documentation

## 2017-08-21 DIAGNOSIS — Z720 Tobacco use: Secondary | ICD-10-CM | POA: Insufficient documentation

## 2017-08-21 DIAGNOSIS — C61 Malignant neoplasm of prostate: Secondary | ICD-10-CM | POA: Diagnosis present

## 2017-08-21 DIAGNOSIS — E119 Type 2 diabetes mellitus without complications: Secondary | ICD-10-CM | POA: Insufficient documentation

## 2017-08-21 DIAGNOSIS — Z7984 Long term (current) use of oral hypoglycemic drugs: Secondary | ICD-10-CM | POA: Insufficient documentation

## 2017-08-21 DIAGNOSIS — Z8042 Family history of malignant neoplasm of prostate: Secondary | ICD-10-CM | POA: Diagnosis not present

## 2017-08-21 DIAGNOSIS — Z8051 Family history of malignant neoplasm of kidney: Secondary | ICD-10-CM | POA: Diagnosis not present

## 2017-08-21 HISTORY — PX: RADIOACTIVE SEED IMPLANT: SHX5150

## 2017-08-21 HISTORY — DX: Hyperlipidemia, unspecified: E78.5

## 2017-08-21 HISTORY — PX: SPACE OAR INSTILLATION: SHX6769

## 2017-08-21 HISTORY — PX: CYSTOSCOPY: SHX5120

## 2017-08-21 HISTORY — DX: Other primary hyperaldosteronism: E26.09

## 2017-08-21 LAB — GLUCOSE, CAPILLARY
GLUCOSE-CAPILLARY: 92 mg/dL (ref 65–99)
GLUCOSE-CAPILLARY: 97 mg/dL (ref 65–99)

## 2017-08-21 SURGERY — INSERTION, RADIATION SOURCE, PROSTATE
Anesthesia: General | Site: Rectum

## 2017-08-21 MED ORDER — FENTANYL CITRATE (PF) 100 MCG/2ML IJ SOLN
INTRAMUSCULAR | Status: AC
  Filled 2017-08-21: qty 2

## 2017-08-21 MED ORDER — FLEET ENEMA 7-19 GM/118ML RE ENEM
1.0000 | ENEMA | Freq: Once | RECTAL | Status: DC
  Filled 2017-08-21: qty 1

## 2017-08-21 MED ORDER — EPHEDRINE SULFATE-NACL 50-0.9 MG/10ML-% IV SOSY
PREFILLED_SYRINGE | INTRAVENOUS | Status: DC | PRN
  Administered 2017-08-21: 10 mg via INTRAVENOUS

## 2017-08-21 MED ORDER — SODIUM CHLORIDE 0.9 % IJ SOLN
INTRAMUSCULAR | Status: DC | PRN
  Administered 2017-08-21: 10 mL

## 2017-08-21 MED ORDER — CEFAZOLIN SODIUM-DEXTROSE 2-4 GM/100ML-% IV SOLN
2.0000 g | Freq: Once | INTRAVENOUS | Status: AC
  Administered 2017-08-21: 2 g via INTRAVENOUS
  Filled 2017-08-21: qty 100

## 2017-08-21 MED ORDER — PROPOFOL 10 MG/ML IV BOLUS
INTRAVENOUS | Status: AC
  Filled 2017-08-21: qty 20

## 2017-08-21 MED ORDER — IOHEXOL 300 MG/ML  SOLN
INTRAMUSCULAR | Status: DC | PRN
  Administered 2017-08-21: 7 mL

## 2017-08-21 MED ORDER — LIDOCAINE 2% (20 MG/ML) 5 ML SYRINGE
INTRAMUSCULAR | Status: AC
  Filled 2017-08-21: qty 5

## 2017-08-21 MED ORDER — FENTANYL CITRATE (PF) 100 MCG/2ML IJ SOLN
25.0000 ug | INTRAMUSCULAR | Status: DC | PRN
  Filled 2017-08-21: qty 1

## 2017-08-21 MED ORDER — ONDANSETRON HCL 4 MG/2ML IJ SOLN
INTRAMUSCULAR | Status: DC | PRN
  Administered 2017-08-21: 4 mg via INTRAVENOUS

## 2017-08-21 MED ORDER — LIDOCAINE 2% (20 MG/ML) 5 ML SYRINGE
INTRAMUSCULAR | Status: DC | PRN
  Administered 2017-08-21: 60 mg via INTRAVENOUS

## 2017-08-21 MED ORDER — MIDAZOLAM HCL 2 MG/2ML IJ SOLN
INTRAMUSCULAR | Status: AC
  Filled 2017-08-21: qty 2

## 2017-08-21 MED ORDER — TRAMADOL HCL 50 MG PO TABS: 50.0000 mg | ORAL_TABLET | Freq: Four times a day (QID) | ORAL | 0 refills | Status: AC | PRN

## 2017-08-21 MED ORDER — SODIUM CHLORIDE 0.9 % IR SOLN
Status: DC | PRN
  Administered 2017-08-21: 1000 mL via INTRAVESICAL

## 2017-08-21 MED ORDER — CEFAZOLIN SODIUM-DEXTROSE 2-4 GM/100ML-% IV SOLN
INTRAVENOUS | Status: AC
  Filled 2017-08-21: qty 100

## 2017-08-21 MED ORDER — PROMETHAZINE HCL 25 MG/ML IJ SOLN
6.2500 mg | INTRAMUSCULAR | Status: DC | PRN
  Filled 2017-08-21: qty 1

## 2017-08-21 MED ORDER — ONDANSETRON HCL 4 MG/2ML IJ SOLN
INTRAMUSCULAR | Status: AC
  Filled 2017-08-21: qty 2

## 2017-08-21 MED ORDER — DEXAMETHASONE SODIUM PHOSPHATE 10 MG/ML IJ SOLN
INTRAMUSCULAR | Status: DC | PRN
  Administered 2017-08-21: 10 mg via INTRAVENOUS

## 2017-08-21 MED ORDER — MIDAZOLAM HCL 2 MG/2ML IJ SOLN
INTRAMUSCULAR | Status: DC | PRN
  Administered 2017-08-21: 2 mg via INTRAVENOUS

## 2017-08-21 MED ORDER — PROPOFOL 10 MG/ML IV BOLUS
INTRAVENOUS | Status: DC | PRN
  Administered 2017-08-21: 200 mg via INTRAVENOUS

## 2017-08-21 MED ORDER — FENTANYL CITRATE (PF) 100 MCG/2ML IJ SOLN
INTRAMUSCULAR | Status: DC | PRN
  Administered 2017-08-21 (×2): 25 ug via INTRAVENOUS
  Administered 2017-08-21: 50 ug via INTRAVENOUS

## 2017-08-21 MED ORDER — DEXAMETHASONE SODIUM PHOSPHATE 10 MG/ML IJ SOLN
INTRAMUSCULAR | Status: AC
  Filled 2017-08-21: qty 1

## 2017-08-21 MED ORDER — LACTATED RINGERS IV SOLN
INTRAVENOUS | Status: DC
  Administered 2017-08-21 (×2): via INTRAVENOUS
  Filled 2017-08-21: qty 1000

## 2017-08-21 SURGICAL SUPPLY — 33 items
BAG URINE DRAINAGE (UROLOGICAL SUPPLIES) ×5 IMPLANT
BLADE CLIPPER SURG (BLADE) ×5 IMPLANT
CATH FOLEY 2WAY SLVR  5CC 16FR (CATHETERS) ×2
CATH FOLEY 2WAY SLVR 5CC 16FR (CATHETERS) ×3 IMPLANT
CATH ROBINSON RED A/P 16FR (CATHETERS) IMPLANT
CATH ROBINSON RED A/P 20FR (CATHETERS) ×5 IMPLANT
CLOTH BEACON ORANGE TIMEOUT ST (SAFETY) ×5 IMPLANT
COVER BACK TABLE 60X90IN (DRAPES) ×5 IMPLANT
COVER MAYO STAND STRL (DRAPES) ×5 IMPLANT
DRSG TEGADERM 4X4.75 (GAUZE/BANDAGES/DRESSINGS) ×5 IMPLANT
DRSG TEGADERM 8X12 (GAUZE/BANDAGES/DRESSINGS) ×5 IMPLANT
GAUZE SPONGE 4X4 12PLY STRL (GAUZE/BANDAGES/DRESSINGS) ×5 IMPLANT
GLOVE BIO SURGEON STRL SZ 6.5 (GLOVE) ×4 IMPLANT
GLOVE BIO SURGEON STRL SZ8 (GLOVE) ×10 IMPLANT
GLOVE BIO SURGEONS STRL SZ 6.5 (GLOVE) ×1
GLOVE BIOGEL PI IND STRL 6.5 (GLOVE) ×3 IMPLANT
GLOVE BIOGEL PI IND STRL 7.5 (GLOVE) ×3 IMPLANT
GLOVE BIOGEL PI INDICATOR 6.5 (GLOVE) ×2
GLOVE BIOGEL PI INDICATOR 7.5 (GLOVE) ×2
GLOVE ECLIPSE 8.0 STRL XLNG CF (GLOVE) ×20 IMPLANT
GOWN STRL REUS W/TWL XL LVL3 (GOWN DISPOSABLE) ×10 IMPLANT
HOLDER FOLEY CATH W/STRAP (MISCELLANEOUS) IMPLANT
I-SEEDAGX100 ×500 IMPLANT
IMPL SPACEOAR SYSTEM 10ML (MISCELLANEOUS) ×3 IMPLANT
IMPLANT SPACEOAR SYSTEM 10ML (MISCELLANEOUS) ×5
IV NS 1000ML (IV SOLUTION) ×2
IV NS 1000ML BAXH (IV SOLUTION) ×3 IMPLANT
KIT TURNOVER CYSTO (KITS) ×5 IMPLANT
PACK CYSTO (CUSTOM PROCEDURE TRAY) ×5 IMPLANT
SUT BONE WAX W31G (SUTURE) IMPLANT
SYRINGE 10CC LL (SYRINGE) ×5 IMPLANT
UNDERPAD 30X30 (UNDERPADS AND DIAPERS) ×10 IMPLANT
WATER STERILE IRR 500ML POUR (IV SOLUTION) ×5 IMPLANT

## 2017-08-21 NOTE — Anesthesia Procedure Notes (Signed)
Procedure Name: LMA Insertion Date/Time: 08/21/2017 2:24 PM Performed by: Suan Halter, CRNA Pre-anesthesia Checklist: Patient identified, Emergency Drugs available, Suction available and Patient being monitored Patient Re-evaluated:Patient Re-evaluated prior to induction Oxygen Delivery Method: Circle system utilized Preoxygenation: Pre-oxygenation with 100% oxygen Induction Type: IV induction Ventilation: Mask ventilation without difficulty LMA: LMA inserted LMA Size: 4.0 Number of attempts: 1 Airway Equipment and Method: Bite block Placement Confirmation: positive ETCO2 Tube secured with: Tape Dental Injury: Teeth and Oropharynx as per pre-operative assessment

## 2017-08-21 NOTE — Anesthesia Preprocedure Evaluation (Addendum)
Anesthesia Evaluation  Patient identified by MRN, date of birth, ID band Patient awake    Reviewed: Allergy & Precautions, NPO status , Patient's Chart, lab work & pertinent test results, reviewed documented beta blocker date and time   Airway Mallampati: II  TM Distance: >3 FB Neck ROM: Full    Dental  (+) Teeth Intact, Dental Advisory Given   Pulmonary neg pulmonary ROS,    Pulmonary exam normal breath sounds clear to auscultation       Cardiovascular hypertension, Pt. on medications and Pt. on home beta blockers Normal cardiovascular exam Rhythm:Regular Rate:Normal     Neuro/Psych negative neurological ROS  negative psych ROS   GI/Hepatic negative GI ROS, Neg liver ROS,   Endo/Other  diabetes, Type 2, Oral Hypoglycemic Agents  Renal/GU negative Renal ROS   Prostate cancer    Musculoskeletal negative musculoskeletal ROS (+)   Abdominal   Peds  Hematology negative hematology ROS (+)   Anesthesia Other Findings Day of surgery medications reviewed with the patient.  Reproductive/Obstetrics                            Anesthesia Physical Anesthesia Plan  ASA: II  Anesthesia Plan: General   Post-op Pain Management:    Induction: Intravenous  PONV Risk Score and Plan: 2  Airway Management Planned: LMA  Additional Equipment:   Intra-op Plan:   Post-operative Plan: Extubation in OR  Informed Consent: I have reviewed the patients History and Physical, chart, labs and discussed the procedure including the risks, benefits and alternatives for the proposed anesthesia with the patient or authorized representative who has indicated his/her understanding and acceptance.   Dental advisory given  Plan Discussed with: CRNA  Anesthesia Plan Comments:        Anesthesia Quick Evaluation

## 2017-08-21 NOTE — Anesthesia Postprocedure Evaluation (Signed)
Anesthesia Post Note  Patient: Devin Gibson  Procedure(s) Performed: RADIOACTIVE SEED IMPLANT/BRACHYTHERAPY IMPLANT (N/A Prostate) SPACE OAR INSTILLATION (N/A Rectum) CYSTOSCOPY FLEXIBLE (N/A Bladder)     Patient location during evaluation: PACU Anesthesia Type: General Level of consciousness: awake and alert Pain management: pain level controlled Vital Signs Assessment: post-procedure vital signs reviewed and stable Respiratory status: spontaneous breathing, nonlabored ventilation, respiratory function stable and patient connected to nasal cannula oxygen Cardiovascular status: blood pressure returned to baseline and stable Postop Assessment: no apparent nausea or vomiting Anesthetic complications: no    Last Vitals:  Vitals:   08/21/17 1645 08/21/17 1700  BP: 138/88 (!) 148/91  Pulse: 76 89  Resp: 15 19  Temp:    SpO2: 99% 98%    Last Pain:  Vitals:   08/21/17 1645  PainSc: 0-No pain                 Rashan Patient S

## 2017-08-21 NOTE — Op Note (Signed)
Preoperative diagnosis: Clinical stage TI C adenocarcinoma the prostate   Postoperative diagnosis: Same   Procedure: I-125 prostate seed implantation with Nucletron robotic implanter, flexible cystoscopy  Surgeon: Lillette Boxer. Raima Geathers M.D.  Radiation Oncologist: Tyler Pita, M.D.  Anesthesia: Gen.   Indications: Patient  was diagnosed with clinical stage TI C prostate cancer. We had extensive discussion with him about treatment options versus. He elected to proceed with seed implantation. He underwent consultation my office as well as with Dr. Tammi Klippel. He appeared to understand the advantages disadvantages potential risks of this treatment option. Full informed consent has been obtained.   Technique and findings: Patient was brought the operating room where he had successful induction of general anesthesia. He was placed in dorso-lithotomy position and prepped and draped in usual manner. Appropriate surgical timeout was performed. Radiation oncology department placed a transrectal ultrasound probe anchoring stand. Foley catheter with contrast in the balloon was inserted without difficulty. Anchoring needles were placed within the prostate. Rectal tube was placed. Real-time contouring of the urethra prostate and rectum were performed and the dosing parameters were established. Targeted dose was 145 gray.  I was then called  to the operating suite suite for placement of the needles. A second timeout was performed. All needle passage was done with real-time transrectal ultrasound guidance with the sagittal plane. A total of 24 needles were placed. The implantation itself was done with the robotic implanter. 100 active seeds were implanted.  I then proceeded with placement of SpaceOARby introducing a needle with the bevel angled inferiorly approximately 2 cm superior to the anus. This was angled downward and under direct ultrasound was placed within the space between the prostatic capsule and  rectum. This was confirmed with a small amount of sterile saline injected and this was performed under direct ultrasound. I then attached the SpaceOARto the needle and injected this in the space between the prostate and rectum with good placement noted. The Foley catheter was removed and flexible cystoscopy failed to show any seeds outside the prostate.  The patient was brought to recovery room in stable condition, having tolerated the procedure well.Marland Kitchen

## 2017-08-21 NOTE — H&P (Signed)
H&P  Chief Complaint: Prostate cancer  History of Present Illness: This 57 year old male presents for I-125 brachytherapy and placement of  Space OAR.  He underwent ultrasound and biopsy of his prostate on 12/26/2016. At that time, PSA was 6.32. Prostatic volume 63.5 mL. PSA density 0.10.   4/12 cores revealed adenocarcinoma, Gleason 3+3 = 6 pattern. Positive biopsies were taken the right lateral mid gland, right apex medial, right apex lateral and right mid gland. His brother has been diagnosed prostate cancer. He was diagnosed in [facility], and presented to Dr. Tyler Pita for radiotherapy consultation.   Past Medical History:  Diagnosis Date  . Diabetes mellitus without complication ([place])   . Family history of bladder cancer   . Family history of breast cancer   . Family history of kidney cancer   . Family history of prostate cancer   . 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 Hyperlipidemia   . Hypertension   . Prostate cancer [facility])     Past Surgical History:  Procedure Laterality Date  . COLONOSCOPY    . PROSTATE BIOPSY      Home Medications:  Allergies as of 08/21/2017   No Known Allergies     Medication List    Notice   Cannot display discharge medications because the patient has not yet been admitted.     Allergies: No Known Allergies  Family History  Problem Relation Age of Onset  . Kidney cancer Sister 11       removed kidney/oral medication  . Prostate cancer Brother 40       prostatectomy  . Breast cancer Sister 36       lumpectomy and chemotherapy  . Leukemia Father 79  . Cancer Paternal Aunt        type unk  . Lung cancer Paternal Uncle   . Bladder Cancer Other        dx >50     Social History:  reports that he has never smoked. His smokeless tobacco use includes snuff. He reports that he does not drink alcohol or use drugs.  ROS: A complete review of systems was performed.  All systems are negative  except for pertinent findings as noted.  Physical Exam:  Vital signs in last 24 hours:   Constitutional:  Alert and oriented, No acute distress Cardiovascular: Regular rate and rhythm, No JVD Respiratory: Normal respiratory effort, Lungs clear bilaterally GI: Abdomen is soft, nontender, nondistended, no abdominal masses Genitourinary: No CVAT. Normal male phallus, testes are descended bilaterally and non-tender and without masses, scrotum is normal in appearance without lesions or masses, perineum is normal on inspection. Rectal: Normal sphincter tone, no rectal masses, prostate is non tender and without nodularity. Prostate size is estimated to be 50 cc Lymphatic: No lymphadenopathy Neurologic: Grossly intact, no focal deficits Psychiatric: Normal mood and affect  Laboratory Data:  No results for input(s): WBC, HGB, HCT, PLT in the last 72 hours.  No results for input(s): NA, K, CL, GLUCOSE, BUN, CALCIUM, CREATININE in the last 72 hours.  Invalid input(s): CO3   No results found for this or any previous visit (from the past 24 hour(s)). No results found for this or any previous visit (from the past 240 hour(s)).  Renal Function: No results for input(s): CREATININE in the last 168 hours. Estimated Creatinine Clearance: 68.5 mL/min (by C-G formula based on SCr of 1.19 mg/dL).  Radiologic Imaging: No results found.  Impression/Assessment:  Adenocarcinoma of prostate  Plan:  I-125 brachytherapy, placement of SpaceOAR.

## 2017-08-21 NOTE — Discharge Instructions (Signed)
Radioactive Seed Implant Home Care Instructions   Activity:    Rest for the remainder of the day.  Do not drive or operate equipment today.  You may resume normal  activities in a few days as instructed by your physician, without risk of harmful radiation exposure to those around you, provided you follow the time and distance precautions on the Radiation Oncology Instruction Sheet.   Meals: Drink plenty of lipuids and eat light foods, such as gelatin or soup this evening .  You may return to normal meal plan tomorrow.  Return To Work: You may return to work as instructed by your physician.  Special Instruction:   If any seeds are found, use tweezers to pick up seeds and place in a glass container of any kind and bring to your physician's office.  Call your physician if any of these symptoms occur:   Persistent or heavy bleeding  Urine stream diminishes or stops completely after catheter is removed  Fever equal to or greater than 101 degrees F  Cloudy urine with a strong foul odor  Severe pain  You may feel some burning pain and/or hesitancy when you urinate after the catheter is removed.  These symptoms may increase over the next few weeks, but should diminish within forur to six weeks.  Applying moist heat to the lower abdomen or a hot tub bath may help relieve the pain.  If the discomfort becomes severe, please call your physician for additional medications.     Post Anesthesia Home Care Instructions  Activity: Get plenty of rest for the remainder of the day. A responsible individual must stay with you for 24 hours following the procedure.  For the next 24 hours, DO NOT: -Drive a car -Operate machinery -Drink alcoholic beverages -Take any medication unless instructed by your physician -Make any legal decisions or sign important papers.  Meals: Start with liquid foods such as gelatin or soup. Progress to regular foods as tolerated. Avoid greasy, spicy, heavy foods. If  nausea and/or vomiting occur, drink only clear liquids until the nausea and/or vomiting subsides. Call your physician if vomiting continues.  Special Instructions/Symptoms: Your throat may feel dry or sore from the anesthesia or the breathing tube placed in your throat during surgery. If this causes discomfort, gargle with warm salt water. The discomfort should disappear within 24 hours.  If you had a scopolamine patch placed behind your ear for the management of post- operative nausea and/or vomiting:  1. The medication in the patch is effective for 72 hours, after which it should be removed.  Wrap patch in a tissue and discard in the trash. Wash hands thoroughly with soap and water. 2. You may remove the patch earlier than 72 hours if you experience unpleasant side effects which may include dry mouth, dizziness or visual disturbances. 3. Avoid touching the patch. Wash your hands with soap and water after contact with the patch.     

## 2017-08-22 ENCOUNTER — Encounter (HOSPITAL_BASED_OUTPATIENT_CLINIC_OR_DEPARTMENT_OTHER): Payer: Self-pay | Admitting: Urology

## 2017-08-22 NOTE — Transfer of Care (Signed)
Immediate Anesthesia Transfer of Care Note  Patient: Devin Gibson  Procedure(s) Performed: RADIOACTIVE SEED IMPLANT/BRACHYTHERAPY IMPLANT (N/A Prostate) SPACE OAR INSTILLATION (N/A Rectum) CYSTOSCOPY FLEXIBLE (N/A Bladder)  Patient Location: PACU  Anesthesia Type:General  Level of Consciousness: awake, alert , oriented and patient cooperative  Airway & Oxygen Therapy: Patient Spontanous Breathing and Patient connected to nasal cannula oxygen  Post-op Assessment: Report given to RN and Post -op Vital signs reviewed and stable  Post vital signs: Reviewed and stable  Last Vitals:  Vitals Value Taken Time  BP    Temp    Pulse    Resp    SpO2      Last Pain:  Vitals:   08/21/17 1730  PainSc: 0-No pain         Complications: No apparent anesthesia complications

## 2017-08-29 ENCOUNTER — Telehealth: Payer: Self-pay | Admitting: *Deleted

## 2017-08-29 NOTE — Telephone Encounter (Signed)
CALLED PATIENT TO INFORM OF MRI AND POST SEED APPTS., LVM FOR A RETURN CALL

## 2017-08-30 NOTE — Progress Notes (Signed)
Radiation Oncology         [phone] ________________________________  Name: Devin Gibson MRN: [id number]  Date: 09/01/2017  DOB: 03-17-1961  Post-Seed Follow-Up Visit Note  CC: Jefm Petty, MD  Ollen Bowl, MD  Diagnosis:   57 y.o. 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  Interval Since Last Radiation:  2 weeks -  08/21/17:  Insertion of radioactive I-125 seeds into the prostate gland, 145 Gy definitive therapy, with SpaceOAR  Narrative:  The patient returns today for routine follow-up.  He is complaining of increased urinary frequency and urinary hesitation symptoms. He filled out a questionnaire regarding urinary function today providing an overall IPSS score of 17 characterizing his symptoms as moderate.  He reports increased daytime frequency, urgency, intermittency and nocturia x2 per night.  He denies dysuria, gross hematuria, incontinence or feelings of incomplete emptying.  His pre-implant score was 6. He denies any bowel symptoms.  He reports a healthy appetite and is maintaining his weight.  He denies recent fever, chills or night sweats.  ALLERGIES:  has No Known Allergies.  Meds: Current Outpatient Medications  Medication Sig Dispense Refill  . amLODipine-benazepril (LOTREL) 10-40 MG capsule Take by mouth.    Marland Kitchen atorvastatin (LIPITOR) 20 MG tablet Take 20 mg by mouth.     . metFORMIN (GLUCOPHAGE) 1000 MG tablet Take by mouth daily with breakfast.     . metoprolol succinate (TOPROL-XL) 100 MG 24 hr tablet Take 100 mg by mouth daily. Take with or immediately following a meal.    . spironolactone (ALDACTONE) 50 MG tablet Take 50 mg by mouth once.     . traMADol (ULTRAM) 50 MG tablet Take 1 tablet (50 mg total) by mouth every 6 (six) hours as needed. 10 tablet 0   No current facility-administered medications for this encounter.     Physical Findings: In general this is a well  appearing African-American male in no acute distress. He's alert and oriented x4 and appropriate throughout the examination. Cardiopulmonary assessment is negative for acute distress and he exhibits normal effort.   Lab Findings: Lab Results  Component Value Date   WBC 5.9 08/14/2017   HGB 14.8 08/14/2017   HCT 45.1 08/14/2017   MCV 86.7 08/14/2017   PLT 235 08/14/2017    Radiographic Findings:  Patient underwent CT imaging in our clinic for post implant dosimetry. The CT was reviewed by Dr. Tammi Klippel and appears to demonstrate an adequate distribution of radioactive seeds throughout the prostate gland. There are no seeds in or near the rectum.  He had a prostate MRI at 7 AM this morning and these images will be fused with his CT images for further evaluation.  We suspect the final radiation plan and dosimetry will show appropriate coverage of the prostate gland.   Impression/Plan: The patient is recovering from the effects of radiation. His urinary symptoms should gradually improve over the next 4-6 months. We talked about this today. He is encouraged by his improvement already and is otherwise pleased with his outcome. We also talked about long-term follow-up for prostate cancer following seed implant. He understands that ongoing PSA determinations and digital rectal exams will help perform surveillance to rule out disease recurrence. He has a follow up appointment scheduled with Azucena Fallen, NP on 09/11/17. He understands what to expect with his PSA measures. Patient was also educated today about some of the long-term effects from radiation including a small  risk for rectal bleeding and possibly erectile dysfunction. We talked about some of the general management approaches to these potential complications. However, I did encourage the patient to contact our office or return at any point if he has questions or concerns related to his previous radiation and prostate cancer.    Nicholos Johns,  PA-C  This document serves as a record of services personally performed by [employer], PA-C. It was created on her behalf by Rae Lips, a trained medical scribe. The creation of this record is based on the scribe's personal observations and the provider's statements to them. This document has been checked and approved by the attending provider.

## 2017-08-30 NOTE — Progress Notes (Signed)
  Radiation Oncology         [phone] ________________________________  Name: Friend Dorfman MRN: [id number]  Date: 09/01/2017  DOB: 11-21-1960  COMPLEX SIMULATION NOTE  NARRATIVE:  The patient was brought to the [facility] today following prostate seed implantation approximately one month ago.  Identity was confirmed.  All relevant records and images related to the planned course of therapy were reviewed.  Then, the patient was set-up supine.  CT images were obtained.  The CT images were loaded into the planning software.  Then the prostate and rectum were contoured.  Treatment planning then occurred.  The implanted iodine 125 seeds were identified by the physics staff for projection of radiation distribution  I have requested : 3D Simulation  I have requested a DVH of the following structures: Prostate and rectum.    ________________________________  Sheral Apley Tammi Klippel, M.D.  This document serves as a record of services personally performed by Tyler Pita, MD. It was created on his behalf by Rae Lips, a trained medical scribe. The creation of this record is based on the scribe's personal observations and the provider's statements to them. This document has been checked and approved by the attending provider.

## 2017-08-31 ENCOUNTER — Telehealth: Payer: Self-pay | Admitting: *Deleted

## 2017-08-31 NOTE — Telephone Encounter (Signed)
CALLED PATIENT TO REMIND OF MRI AND POST SEED APPTS. FOR 09-01-17, SPOKE WITH PATIENT'S SGO AND SHE IS AWARE OF THESE APPTS.

## 2017-09-01 ENCOUNTER — Ambulatory Visit
Admission: RE | Admit: 2017-09-01 | Discharge: 2017-09-01 | Disposition: A | Discharge status: Home / Self Care | Payer: 59 | Source: Ambulatory Visit | Attending: Radiation Oncology | Admitting: Radiation Oncology

## 2017-09-01 ENCOUNTER — Encounter: Payer: Self-pay | Admitting: Radiation Oncology

## 2017-09-01 ENCOUNTER — Ambulatory Visit (HOSPITAL_COMMUNITY)
Admission: RE | Admit: 2017-09-01 | Discharge: 2017-09-01 | Disposition: A | Discharge status: Home / Self Care | Payer: 59 | Source: Ambulatory Visit | Attending: Urology | Admitting: Urology

## 2017-09-01 ENCOUNTER — Encounter: Payer: Self-pay | Admitting: Medical Oncology

## 2017-09-01 ENCOUNTER — Other Ambulatory Visit: Payer: Self-pay

## 2017-09-01 VITALS — BP 132/89 | HR 69 | Temp 98.1°F | Resp 18

## 2017-09-01 DIAGNOSIS — C61 Malignant neoplasm of prostate: Secondary | ICD-10-CM | POA: Diagnosis not present

## 2017-09-01 DIAGNOSIS — Z510 Encounter for antineoplastic radiation therapy: Secondary | ICD-10-CM | POA: Insufficient documentation

## 2017-09-01 DIAGNOSIS — Z79899 Other long term (current) drug therapy: Secondary | ICD-10-CM | POA: Diagnosis not present

## 2017-09-01 DIAGNOSIS — Z923 Personal history of irradiation: Secondary | ICD-10-CM | POA: Diagnosis not present

## 2017-09-01 DIAGNOSIS — Z7984 Long term (current) use of oral hypoglycemic drugs: Secondary | ICD-10-CM | POA: Insufficient documentation

## 2017-09-01 NOTE — Progress Notes (Signed)
Mr. Cutler states that seed implant went well. He states he has urinary urgency and dysuria has resolved. He has follow up 09/11/17 with Dr. Diona Fanti.

## 2017-09-01 NOTE — Progress Notes (Signed)
Received patient in the clinic following post seed simulation. Weight and vitals stable. Denies pain. Pre seed IPSS 6. Post seed IPSS 17. Reports dysuria resolved. Reports occasional hematuria. Reports urinary urgency. Denies urinary leakage or incontinence. Denies diarrhea. Patient had MRI to confirm SpaceOar placement at 0700. Patient scheduled to follow up at [facility] 09/11/2017.  BP 132/89 (BP Location: Right Arm, Patient Position: Sitting, Cuff Size: Normal)   Pulse 69   Temp 98.1 F (36.7 C) (Oral)   Resp 18   SpO2 99%  Wt Readings from Last 3 Encounters:  08/21/17 186 lb 11.2 oz (84.7 kg)  05/08/17 191 lb (86.6 kg)

## 2017-09-07 ENCOUNTER — Encounter: Payer: Self-pay | Admitting: Radiation Oncology

## 2017-09-07 ENCOUNTER — Ambulatory Visit: Payer: 59 | Attending: Radiation Oncology | Admitting: Radiation Oncology

## 2017-09-07 DIAGNOSIS — Z510 Encounter for antineoplastic radiation therapy: Secondary | ICD-10-CM | POA: Diagnosis not present

## 2017-09-07 DIAGNOSIS — C61 Malignant neoplasm of prostate: Secondary | ICD-10-CM | POA: Insufficient documentation

## 2017-09-16 NOTE — Progress Notes (Signed)
  Radiation Oncology         [phone] ________________________________  Name: Devin Gibson MRN: [id number]  Date: 09/07/2017  DOB: 05/25/1960  3D Planning Note   Prostate Brachytherapy Post-Implant Dosimetry  Diagnosis: 57 y.o. 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 He underwent CT scan complex simulation to delineate the three-dimensional structures of the pelvis and demonstrate the radiation distribution.  Since that time, the seed localization, and complex isodose planning with dose volume histograms have now been completed.  Results:   Prostate Coverage - The dose of radiation delivered to the 90% or more of the prostate gland (D90) was 97.99% of the prescription dose. This exceeds our goal of greater than 90%. Rectal Sparing - The volume of rectal tissue receiving the prescription dose or higher was 0.0 cc. This falls under our thresholds tolerance of 1.0 cc.  Impression: The prostate seed implant appears to show adequate target coverage and appropriate rectal sparing.  Plan:  The patient will continue to follow with urology for ongoing PSA determinations. I would anticipate a high likelihood for local tumor control with minimal risk for rectal morbidity.  ________________________________  Sheral Apley Tammi Klippel, M.D.

## 2019-09-22 IMAGING — DX DG CHEST 2V
2 series · 2 of 2 positions shown · non-contrast
Comparison: No prior.

CLINICAL DATA: Preoperative evaluation.

EXAM:
CHEST - 2 VIEW

[chest pa]
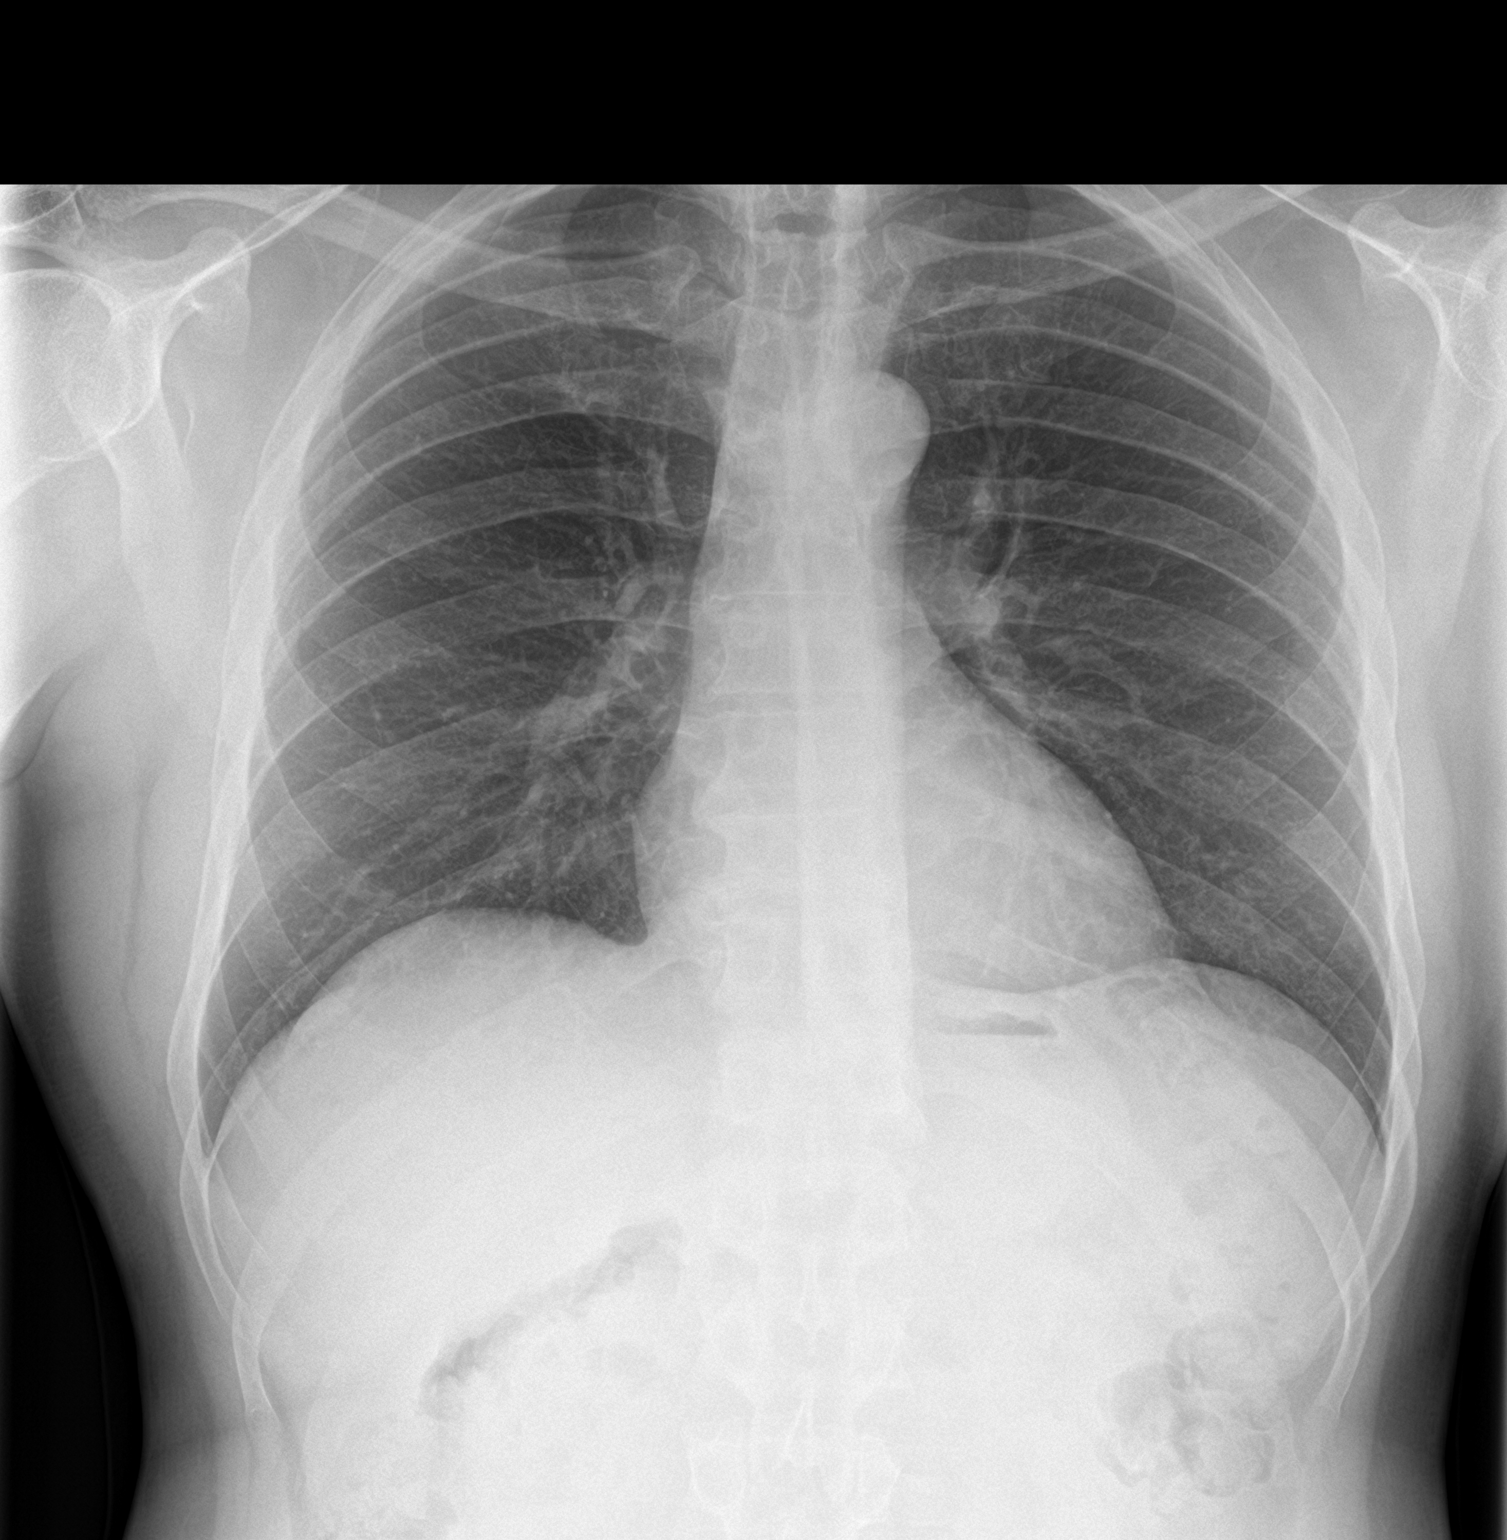

[chest lat]
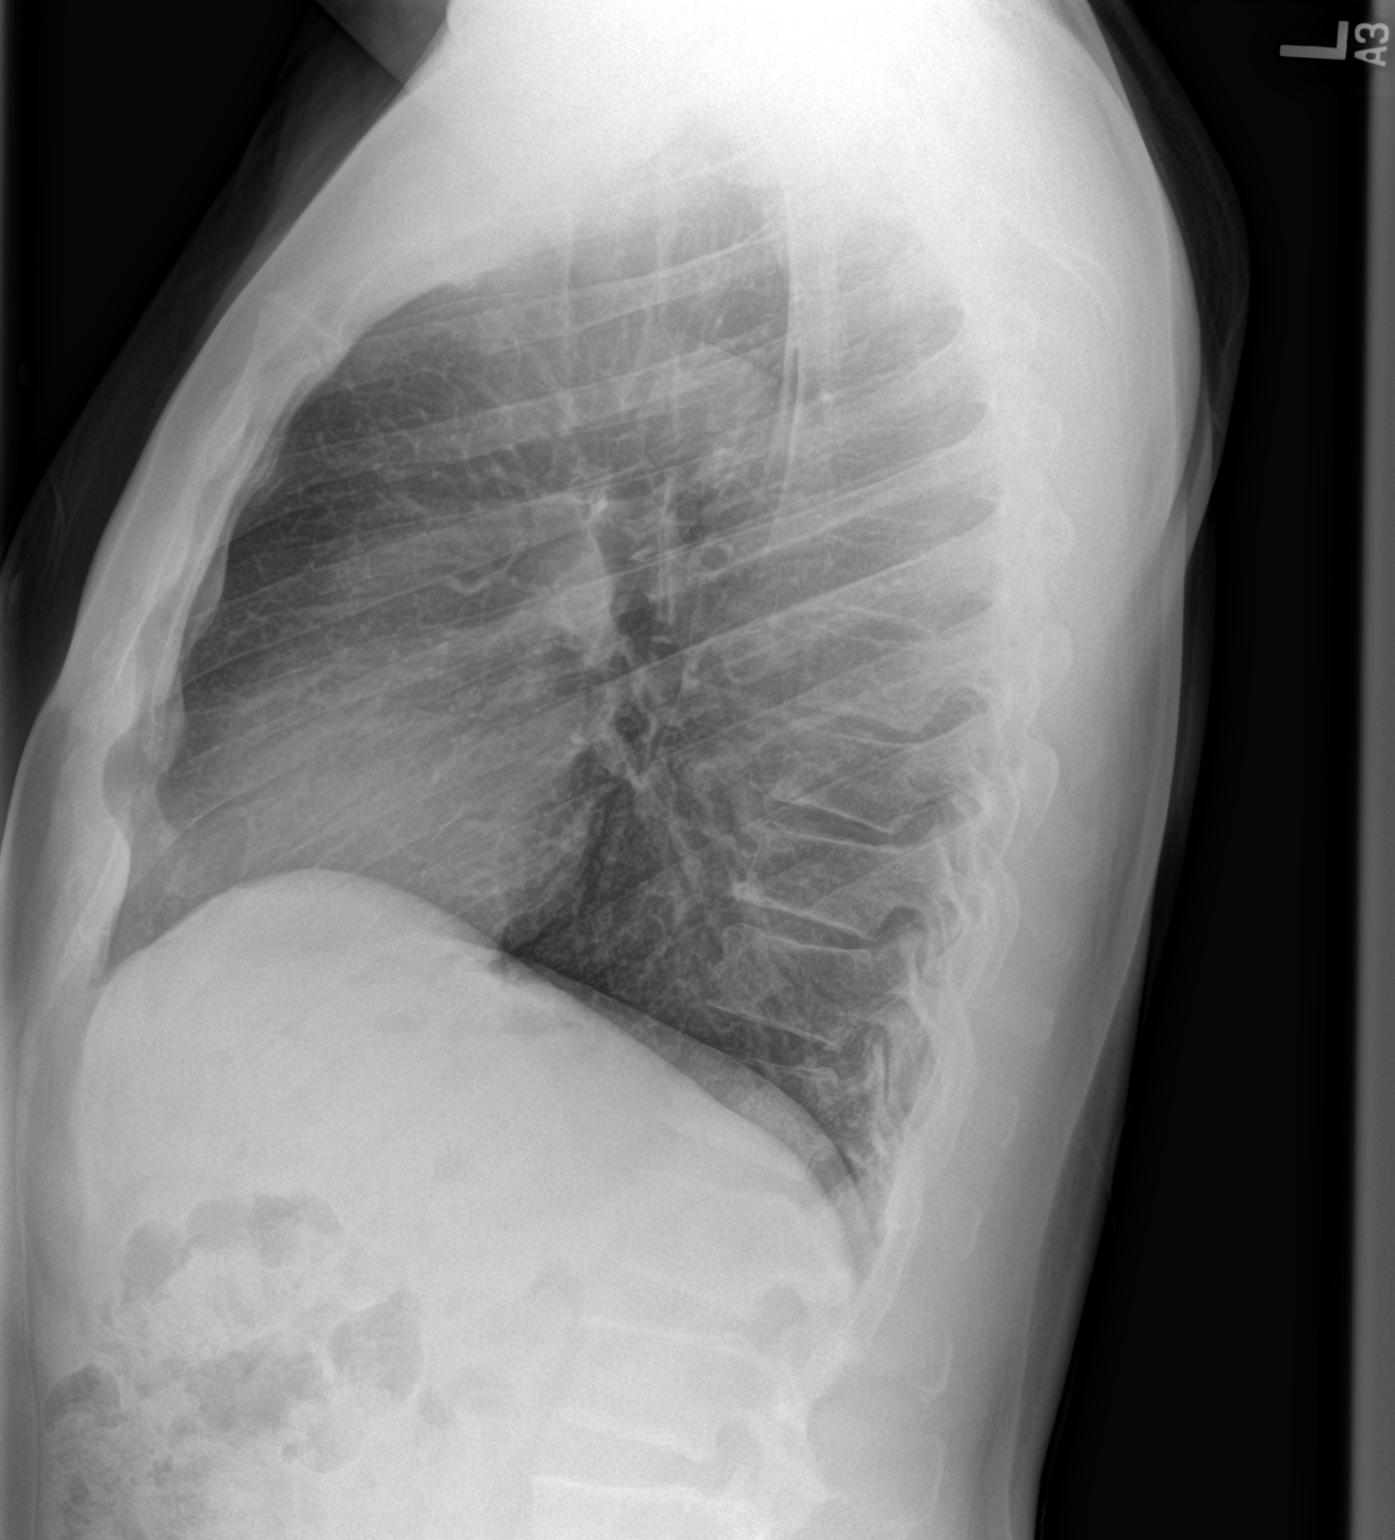

[2 of 2 positions shown; findings below may reference images not displayed]

FINDINGS: Mediastinum and hilar structures normal. Lungs are clear. No pleural
effusion or pneumothorax. Heart size normal. Degenerative changes
thoracic spine.
IMPRESSION: No acute cardiopulmonary disease.
